# Patient Record
Sex: Female | Born: 1965
Health system: Southern US, Community
[De-identification: ages and names within clinical notes are randomized; demographics above are authoritative.]

## PROBLEM LIST (undated history)

## (undated) DIAGNOSIS — K5792 Diverticulitis of intestine, part unspecified, without perforation or abscess without bleeding: Secondary | ICD-10-CM

## (undated) HISTORY — PX: HERNIA REPAIR: SHX51

## (undated) HISTORY — PX: TONSILLECTOMY: SUR1361

## (undated) HISTORY — PX: LITHOTRIPSY: SUR834

## (undated) HISTORY — PX: HYSTEROTOMY: SHX1776

---

## 2016-06-01 DIAGNOSIS — J019 Acute sinusitis, unspecified: Secondary | ICD-10-CM | POA: Diagnosis not present

## 2016-07-02 DIAGNOSIS — Z01419 Encounter for gynecological examination (general) (routine) without abnormal findings: Secondary | ICD-10-CM | POA: Diagnosis not present

## 2016-07-22 DIAGNOSIS — Z1231 Encounter for screening mammogram for malignant neoplasm of breast: Secondary | ICD-10-CM | POA: Diagnosis not present

## 2016-09-04 DIAGNOSIS — L814 Other melanin hyperpigmentation: Secondary | ICD-10-CM | POA: Diagnosis not present

## 2016-09-04 DIAGNOSIS — L821 Other seborrheic keratosis: Secondary | ICD-10-CM | POA: Diagnosis not present

## 2016-09-04 DIAGNOSIS — D2261 Melanocytic nevi of right upper limb, including shoulder: Secondary | ICD-10-CM | POA: Diagnosis not present

## 2016-09-04 DIAGNOSIS — D2239 Melanocytic nevi of other parts of face: Secondary | ICD-10-CM | POA: Diagnosis not present

## 2016-09-04 DIAGNOSIS — D485 Neoplasm of uncertain behavior of skin: Secondary | ICD-10-CM | POA: Diagnosis not present

## 2016-09-04 DIAGNOSIS — L738 Other specified follicular disorders: Secondary | ICD-10-CM | POA: Diagnosis not present

## 2017-03-26 DIAGNOSIS — Z0000 Encounter for general adult medical examination without abnormal findings: Secondary | ICD-10-CM | POA: Diagnosis not present

## 2017-03-26 DIAGNOSIS — Z13220 Encounter for screening for lipoid disorders: Secondary | ICD-10-CM | POA: Diagnosis not present

## 2017-05-26 DIAGNOSIS — Z1211 Encounter for screening for malignant neoplasm of colon: Secondary | ICD-10-CM | POA: Diagnosis not present

## 2017-05-26 DIAGNOSIS — K5730 Diverticulosis of large intestine without perforation or abscess without bleeding: Secondary | ICD-10-CM | POA: Diagnosis not present

## 2017-06-17 ENCOUNTER — Other Ambulatory Visit: Payer: Self-pay | Admitting: Family Medicine

## 2017-06-17 DIAGNOSIS — R1031 Right lower quadrant pain: Secondary | ICD-10-CM

## 2017-06-18 ENCOUNTER — Ambulatory Visit
Admission: RE | Admit: 2017-06-18 | Discharge: 2017-06-18 | Disposition: A | Discharge status: Home / Self Care | Payer: BLUE CROSS/BLUE SHIELD | Source: Ambulatory Visit | Attending: Family Medicine | Admitting: Family Medicine

## 2017-06-18 DIAGNOSIS — K5732 Diverticulitis of large intestine without perforation or abscess without bleeding: Secondary | ICD-10-CM | POA: Diagnosis not present

## 2017-06-18 DIAGNOSIS — R1031 Right lower quadrant pain: Secondary | ICD-10-CM

## 2017-06-18 MED ORDER — IOPAMIDOL (ISOVUE-300) INJECTION 61%
100.0000 mL | Freq: Once | INTRAVENOUS | Status: AC | PRN
  Administered 2017-06-18: 100 mL via INTRAVENOUS

## 2017-07-06 DIAGNOSIS — Z01419 Encounter for gynecological examination (general) (routine) without abnormal findings: Secondary | ICD-10-CM | POA: Diagnosis not present

## 2017-07-23 DIAGNOSIS — Z1231 Encounter for screening mammogram for malignant neoplasm of breast: Secondary | ICD-10-CM | POA: Diagnosis not present

## 2017-09-09 DIAGNOSIS — D225 Melanocytic nevi of trunk: Secondary | ICD-10-CM | POA: Diagnosis not present

## 2017-09-09 DIAGNOSIS — L814 Other melanin hyperpigmentation: Secondary | ICD-10-CM | POA: Diagnosis not present

## 2017-09-09 DIAGNOSIS — L308 Other specified dermatitis: Secondary | ICD-10-CM | POA: Diagnosis not present

## 2017-09-09 DIAGNOSIS — L821 Other seborrheic keratosis: Secondary | ICD-10-CM | POA: Diagnosis not present

## 2017-09-22 DIAGNOSIS — K5732 Diverticulitis of large intestine without perforation or abscess without bleeding: Secondary | ICD-10-CM | POA: Diagnosis not present

## 2018-03-31 DIAGNOSIS — Z23 Encounter for immunization: Secondary | ICD-10-CM | POA: Diagnosis not present

## 2018-03-31 DIAGNOSIS — Z0000 Encounter for general adult medical examination without abnormal findings: Secondary | ICD-10-CM | POA: Diagnosis not present

## 2018-03-31 DIAGNOSIS — Z136 Encounter for screening for cardiovascular disorders: Secondary | ICD-10-CM | POA: Diagnosis not present

## 2018-05-10 DIAGNOSIS — M7542 Impingement syndrome of left shoulder: Secondary | ICD-10-CM | POA: Diagnosis not present

## 2018-05-10 DIAGNOSIS — M7502 Adhesive capsulitis of left shoulder: Secondary | ICD-10-CM | POA: Diagnosis not present

## 2018-05-10 DIAGNOSIS — M25512 Pain in left shoulder: Secondary | ICD-10-CM | POA: Diagnosis not present

## 2018-05-16 DIAGNOSIS — M7542 Impingement syndrome of left shoulder: Secondary | ICD-10-CM | POA: Insufficient documentation

## 2018-05-16 DIAGNOSIS — M7502 Adhesive capsulitis of left shoulder: Secondary | ICD-10-CM | POA: Insufficient documentation

## 2018-06-22 DIAGNOSIS — M7502 Adhesive capsulitis of left shoulder: Secondary | ICD-10-CM | POA: Diagnosis not present

## 2018-06-22 DIAGNOSIS — M25512 Pain in left shoulder: Secondary | ICD-10-CM | POA: Diagnosis not present

## 2018-07-04 DIAGNOSIS — M7502 Adhesive capsulitis of left shoulder: Secondary | ICD-10-CM | POA: Diagnosis not present

## 2018-07-08 DIAGNOSIS — M7542 Impingement syndrome of left shoulder: Secondary | ICD-10-CM | POA: Diagnosis not present

## 2018-07-14 DIAGNOSIS — Z01419 Encounter for gynecological examination (general) (routine) without abnormal findings: Secondary | ICD-10-CM | POA: Diagnosis not present

## 2018-07-29 DIAGNOSIS — Z1231 Encounter for screening mammogram for malignant neoplasm of breast: Secondary | ICD-10-CM | POA: Diagnosis not present

## 2018-09-28 DIAGNOSIS — D2261 Melanocytic nevi of right upper limb, including shoulder: Secondary | ICD-10-CM | POA: Diagnosis not present

## 2018-09-28 DIAGNOSIS — D1801 Hemangioma of skin and subcutaneous tissue: Secondary | ICD-10-CM | POA: Diagnosis not present

## 2018-09-28 DIAGNOSIS — L821 Other seborrheic keratosis: Secondary | ICD-10-CM | POA: Diagnosis not present

## 2018-09-28 DIAGNOSIS — D2262 Melanocytic nevi of left upper limb, including shoulder: Secondary | ICD-10-CM | POA: Diagnosis not present

## 2019-02-16 DIAGNOSIS — R0789 Other chest pain: Secondary | ICD-10-CM | POA: Diagnosis not present

## 2019-04-06 ENCOUNTER — Other Ambulatory Visit: Payer: Self-pay | Admitting: Family Medicine

## 2019-04-06 DIAGNOSIS — Z13220 Encounter for screening for lipoid disorders: Secondary | ICD-10-CM | POA: Diagnosis not present

## 2019-04-06 DIAGNOSIS — Z0000 Encounter for general adult medical examination without abnormal findings: Secondary | ICD-10-CM | POA: Diagnosis not present

## 2019-04-06 DIAGNOSIS — Z8249 Family history of ischemic heart disease and other diseases of the circulatory system: Secondary | ICD-10-CM

## 2019-04-06 DIAGNOSIS — Z23 Encounter for immunization: Secondary | ICD-10-CM | POA: Diagnosis not present

## 2019-04-18 ENCOUNTER — Other Ambulatory Visit: Payer: Self-pay | Admitting: Family Medicine

## 2019-04-18 DIAGNOSIS — Z8249 Family history of ischemic heart disease and other diseases of the circulatory system: Secondary | ICD-10-CM

## 2019-04-19 ENCOUNTER — Ambulatory Visit
Admission: RE | Admit: 2019-04-19 | Discharge: 2019-04-19 | Disposition: A | Discharge status: Home / Self Care | Payer: BC Managed Care – PPO | Source: Ambulatory Visit | Attending: Family Medicine | Admitting: Family Medicine

## 2019-04-19 ENCOUNTER — Other Ambulatory Visit: Payer: Self-pay

## 2019-04-19 DIAGNOSIS — Z8249 Family history of ischemic heart disease and other diseases of the circulatory system: Secondary | ICD-10-CM

## 2019-07-05 DIAGNOSIS — H02845 Edema of left lower eyelid: Secondary | ICD-10-CM | POA: Diagnosis not present

## 2019-07-05 DIAGNOSIS — L219 Seborrheic dermatitis, unspecified: Secondary | ICD-10-CM | POA: Diagnosis not present

## 2019-07-05 DIAGNOSIS — H02842 Edema of right lower eyelid: Secondary | ICD-10-CM | POA: Diagnosis not present

## 2019-07-05 DIAGNOSIS — L814 Other melanin hyperpigmentation: Secondary | ICD-10-CM | POA: Diagnosis not present

## 2019-07-25 DIAGNOSIS — Z01419 Encounter for gynecological examination (general) (routine) without abnormal findings: Secondary | ICD-10-CM | POA: Diagnosis not present

## 2019-08-10 DIAGNOSIS — Z1231 Encounter for screening mammogram for malignant neoplasm of breast: Secondary | ICD-10-CM | POA: Diagnosis not present

## 2019-08-15 DIAGNOSIS — H5310 Unspecified subjective visual disturbances: Secondary | ICD-10-CM | POA: Diagnosis not present

## 2019-08-22 DIAGNOSIS — H43811 Vitreous degeneration, right eye: Secondary | ICD-10-CM | POA: Diagnosis not present

## 2019-08-22 DIAGNOSIS — H43391 Other vitreous opacities, right eye: Secondary | ICD-10-CM | POA: Diagnosis not present

## 2019-09-22 DIAGNOSIS — H43391 Other vitreous opacities, right eye: Secondary | ICD-10-CM | POA: Diagnosis not present

## 2019-09-22 DIAGNOSIS — H43822 Vitreomacular adhesion, left eye: Secondary | ICD-10-CM | POA: Diagnosis not present

## 2019-09-22 DIAGNOSIS — H43811 Vitreous degeneration, right eye: Secondary | ICD-10-CM | POA: Diagnosis not present

## 2019-09-25 DIAGNOSIS — L578 Other skin changes due to chronic exposure to nonionizing radiation: Secondary | ICD-10-CM | POA: Diagnosis not present

## 2019-09-25 DIAGNOSIS — L570 Actinic keratosis: Secondary | ICD-10-CM | POA: Diagnosis not present

## 2019-09-25 DIAGNOSIS — L814 Other melanin hyperpigmentation: Secondary | ICD-10-CM | POA: Diagnosis not present

## 2019-09-25 DIAGNOSIS — D225 Melanocytic nevi of trunk: Secondary | ICD-10-CM | POA: Diagnosis not present

## 2019-09-25 DIAGNOSIS — N3001 Acute cystitis with hematuria: Secondary | ICD-10-CM | POA: Diagnosis not present

## 2019-09-25 DIAGNOSIS — D2371 Other benign neoplasm of skin of right lower limb, including hip: Secondary | ICD-10-CM | POA: Diagnosis not present

## 2019-12-29 DIAGNOSIS — G4700 Insomnia, unspecified: Secondary | ICD-10-CM | POA: Diagnosis not present

## 2019-12-29 DIAGNOSIS — R300 Dysuria: Secondary | ICD-10-CM | POA: Diagnosis not present

## 2019-12-29 DIAGNOSIS — R350 Frequency of micturition: Secondary | ICD-10-CM | POA: Diagnosis not present

## 2019-12-29 DIAGNOSIS — N390 Urinary tract infection, site not specified: Secondary | ICD-10-CM | POA: Diagnosis not present

## 2020-04-16 DIAGNOSIS — Z13220 Encounter for screening for lipoid disorders: Secondary | ICD-10-CM | POA: Diagnosis not present

## 2020-04-16 DIAGNOSIS — Z0000 Encounter for general adult medical examination without abnormal findings: Secondary | ICD-10-CM | POA: Diagnosis not present

## 2020-04-16 DIAGNOSIS — R7303 Prediabetes: Secondary | ICD-10-CM | POA: Diagnosis not present

## 2020-05-01 ENCOUNTER — Ambulatory Visit: Payer: BC Managed Care – PPO | Admitting: Podiatry

## 2020-05-01 ENCOUNTER — Ambulatory Visit (INDEPENDENT_AMBULATORY_CARE_PROVIDER_SITE_OTHER): Payer: BC Managed Care – PPO

## 2020-05-01 ENCOUNTER — Other Ambulatory Visit: Payer: Self-pay

## 2020-05-01 DIAGNOSIS — M79672 Pain in left foot: Secondary | ICD-10-CM

## 2020-05-01 DIAGNOSIS — M79671 Pain in right foot: Secondary | ICD-10-CM

## 2020-05-01 DIAGNOSIS — M85852 Other specified disorders of bone density and structure, left thigh: Secondary | ICD-10-CM | POA: Diagnosis not present

## 2020-05-01 DIAGNOSIS — Z780 Asymptomatic menopausal state: Secondary | ICD-10-CM | POA: Diagnosis not present

## 2020-05-01 MED ORDER — MELOXICAM 15 MG PO TABS: 15.0000 mg | ORAL_TABLET | Freq: Every day | ORAL | 1 refills | Status: DC

## 2020-05-01 NOTE — Progress Notes (Signed)
   HPI: 54 y.o. female very healthy presenting today as a new patient for evaluation of generalized foot pain to the bilateral feet.  Patient states this is been ongoing for several years now.  She wears Vionic shoes and sandals as well as good supportive sneakers from [employer] running store.  Patient states that intermittently she gets what she believes is inflammatory reaction to her the bilateral feet.  She cannot recall any alleviating or aggravating factors other than being on her feet for long periods of time.  She presents for further treatment and evaluation  No past medical history on file.   Physical Exam: General: The patient is alert and oriented x3 in no acute distress.  Dermatology: Skin is warm, dry and supple bilateral lower extremities. Negative for open lesions or macerations.  Vascular: Palpable pedal pulses bilaterally. No edema or erythema noted. Capillary refill within normal limits.  Neurological: Epicritic and protective threshold grossly intact bilaterally.   Musculoskeletal Exam: Range of motion within normal limits to all pedal and ankle joints bilateral. Muscle strength 5/5 in all groups bilateral.   Radiographic Exam:  Normal osseous mineralization. Joint spaces preserved. No fracture/dislocation/boney destruction.  Bipartite sesamoid noted left foot  Assessment: 1.  Generalized foot pain bilateral  Plan of Care:  1. Patient evaluated. X-Rays reviewed.  2. I explained to the patient that her feet appear clinically very healthy.  She is wearing good supportive shoes.  Continue full activity with no restrictions wearing good supportive sneakers and sandals 3.  Prescription for meloxicam 15 mg daily as needed for days that she has increased achiness 4.  Return to clinic as needed      Felecia Shelling, DPM [facility]  Dr. Felecia Shelling, DPM    [address] [place], [place] [address]                  Office [phone]  Fax [phone]

## 2020-05-16 ENCOUNTER — Telehealth: Payer: BC Managed Care – PPO | Admitting: Family Medicine

## 2020-07-01 DIAGNOSIS — R351 Nocturia: Secondary | ICD-10-CM | POA: Diagnosis not present

## 2020-07-01 DIAGNOSIS — N393 Stress incontinence (female) (male): Secondary | ICD-10-CM | POA: Diagnosis not present

## 2020-07-01 DIAGNOSIS — Z01419 Encounter for gynecological examination (general) (routine) without abnormal findings: Secondary | ICD-10-CM | POA: Diagnosis not present

## 2020-07-09 DIAGNOSIS — H40013 Open angle with borderline findings, low risk, bilateral: Secondary | ICD-10-CM | POA: Diagnosis not present

## 2020-07-09 DIAGNOSIS — Z713 Dietary counseling and surveillance: Secondary | ICD-10-CM | POA: Diagnosis not present

## 2020-07-22 DIAGNOSIS — N3946 Mixed incontinence: Secondary | ICD-10-CM | POA: Diagnosis not present

## 2020-08-12 DIAGNOSIS — Z1231 Encounter for screening mammogram for malignant neoplasm of breast: Secondary | ICD-10-CM | POA: Diagnosis not present

## 2020-08-22 DIAGNOSIS — U071 COVID-19: Secondary | ICD-10-CM | POA: Diagnosis not present

## 2020-08-22 DIAGNOSIS — Z20822 Contact with and (suspected) exposure to covid-19: Secondary | ICD-10-CM | POA: Diagnosis not present

## 2020-08-26 DIAGNOSIS — Z03818 Encounter for observation for suspected exposure to other biological agents ruled out: Secondary | ICD-10-CM | POA: Diagnosis not present

## 2020-08-26 DIAGNOSIS — Z20822 Contact with and (suspected) exposure to covid-19: Secondary | ICD-10-CM | POA: Diagnosis not present

## 2020-09-04 DIAGNOSIS — R300 Dysuria: Secondary | ICD-10-CM | POA: Diagnosis not present

## 2020-09-04 DIAGNOSIS — M8588 Other specified disorders of bone density and structure, other site: Secondary | ICD-10-CM | POA: Diagnosis not present

## 2020-09-12 DIAGNOSIS — Z713 Dietary counseling and surveillance: Secondary | ICD-10-CM | POA: Diagnosis not present

## 2020-09-18 DIAGNOSIS — N3946 Mixed incontinence: Secondary | ICD-10-CM | POA: Diagnosis not present

## 2020-09-26 DIAGNOSIS — N390 Urinary tract infection, site not specified: Secondary | ICD-10-CM | POA: Diagnosis not present

## 2020-09-26 DIAGNOSIS — R7303 Prediabetes: Secondary | ICD-10-CM | POA: Diagnosis not present

## 2020-09-26 DIAGNOSIS — R5383 Other fatigue: Secondary | ICD-10-CM | POA: Diagnosis not present

## 2020-10-02 DIAGNOSIS — D2371 Other benign neoplasm of skin of right lower limb, including hip: Secondary | ICD-10-CM | POA: Diagnosis not present

## 2020-10-02 DIAGNOSIS — L570 Actinic keratosis: Secondary | ICD-10-CM | POA: Diagnosis not present

## 2020-10-02 DIAGNOSIS — D225 Melanocytic nevi of trunk: Secondary | ICD-10-CM | POA: Diagnosis not present

## 2020-10-02 DIAGNOSIS — L821 Other seborrheic keratosis: Secondary | ICD-10-CM | POA: Diagnosis not present

## 2020-10-02 DIAGNOSIS — L578 Other skin changes due to chronic exposure to nonionizing radiation: Secondary | ICD-10-CM | POA: Diagnosis not present

## 2020-11-04 DIAGNOSIS — Z90711 Acquired absence of uterus with remaining cervical stump: Secondary | ICD-10-CM | POA: Diagnosis not present

## 2020-11-04 DIAGNOSIS — R5383 Other fatigue: Secondary | ICD-10-CM | POA: Diagnosis not present

## 2020-11-04 DIAGNOSIS — L659 Nonscarring hair loss, unspecified: Secondary | ICD-10-CM | POA: Diagnosis not present

## 2020-11-04 DIAGNOSIS — E538 Deficiency of other specified B group vitamins: Secondary | ICD-10-CM | POA: Diagnosis not present

## 2021-01-07 DIAGNOSIS — N390 Urinary tract infection, site not specified: Secondary | ICD-10-CM | POA: Diagnosis not present

## 2021-02-10 DIAGNOSIS — Z90711 Acquired absence of uterus with remaining cervical stump: Secondary | ICD-10-CM | POA: Diagnosis not present

## 2021-02-10 DIAGNOSIS — E538 Deficiency of other specified B group vitamins: Secondary | ICD-10-CM | POA: Diagnosis not present

## 2021-02-10 DIAGNOSIS — L659 Nonscarring hair loss, unspecified: Secondary | ICD-10-CM | POA: Diagnosis not present

## 2021-02-10 DIAGNOSIS — R5383 Other fatigue: Secondary | ICD-10-CM | POA: Diagnosis not present

## 2021-02-14 DIAGNOSIS — R3989 Other symptoms and signs involving the genitourinary system: Secondary | ICD-10-CM | POA: Diagnosis not present

## 2021-02-14 DIAGNOSIS — N390 Urinary tract infection, site not specified: Secondary | ICD-10-CM | POA: Diagnosis not present

## 2021-02-14 DIAGNOSIS — N3946 Mixed incontinence: Secondary | ICD-10-CM | POA: Diagnosis not present

## 2021-02-27 ENCOUNTER — Emergency Department (HOSPITAL_BASED_OUTPATIENT_CLINIC_OR_DEPARTMENT_OTHER)
Admission: EM | Admit: 2021-02-27 | Discharge: 2021-02-27 | Disposition: A | Discharge status: Home / Self Care | Payer: BC Managed Care – PPO | Attending: Emergency Medicine | Admitting: Emergency Medicine

## 2021-02-27 ENCOUNTER — Encounter (HOSPITAL_BASED_OUTPATIENT_CLINIC_OR_DEPARTMENT_OTHER): Payer: Self-pay | Admitting: Emergency Medicine

## 2021-02-27 ENCOUNTER — Other Ambulatory Visit: Payer: Self-pay

## 2021-02-27 DIAGNOSIS — Z5321 Procedure and treatment not carried out due to patient leaving prior to being seen by health care provider: Secondary | ICD-10-CM | POA: Insufficient documentation

## 2021-02-27 DIAGNOSIS — R109 Unspecified abdominal pain: Secondary | ICD-10-CM | POA: Diagnosis not present

## 2021-02-27 DIAGNOSIS — R1032 Left lower quadrant pain: Secondary | ICD-10-CM | POA: Diagnosis not present

## 2021-02-27 HISTORY — DX: Diverticulitis of intestine, part unspecified, without perforation or abscess without bleeding: K57.92

## 2021-02-27 LAB — COMPREHENSIVE METABOLIC PANEL
ALT: 17 U/L (ref 0–44)
AST: 16 U/L (ref 15–41)
Albumin: 4.7 g/dL (ref 3.5–5.0)
Alkaline Phosphatase: 79 U/L (ref 38–126)
Anion gap: 12 (ref 5–15)
BUN: 12 mg/dL (ref 6–20)
CO2: 24 mmol/L (ref 22–32)
Calcium: 9.3 mg/dL (ref 8.9–10.3)
Chloride: 104 mmol/L (ref 98–111)
Creatinine, Ser: 0.77 mg/dL (ref 0.44–1.00)
GFR, Estimated: >60 mL/min (ref >60)
Glucose, Bld: 95 mg/dL (ref 70–99)
Potassium: 3.3 mmol/L — ABNORMAL LOW (ref 3.5–5.1)
Sodium: 140 mmol/L (ref 135–145)
Total Bilirubin: 1.5 mg/dL — ABNORMAL HIGH (ref 0.3–1.2)
Total Protein: 7.6 g/dL (ref 6.5–8.1)

## 2021-02-27 LAB — CBC
HCT: 38.3 % (ref 36.0–46.0)
Hemoglobin: 13.1 g/dL (ref 12.0–15.0)
MCH: 29.2 pg (ref 26.0–34.0)
MCHC: 34.2 g/dL (ref 30.0–36.0)
MCV: 85.3 fL (ref 80.0–100.0)
Platelets: 217 10*3/uL (ref 150–400)
RBC: 4.49 MIL/uL (ref 3.87–5.11)
RDW: 12.9 % (ref 11.5–15.5)
WBC: 11.4 10*3/uL — ABNORMAL HIGH (ref 4.0–10.5)
nRBC: 0 % (ref 0.0–0.2)

## 2021-02-27 LAB — LIPASE, BLOOD: Lipase: 17 U/L (ref 11–51)

## 2021-02-27 NOTE — ED Triage Notes (Addendum)
Abd pain and constipation since yesterday  center left pain , has hx of diverticulitis she states, states was seen  at [facility] today and told to get CT

## 2021-02-28 ENCOUNTER — Other Ambulatory Visit (HOSPITAL_BASED_OUTPATIENT_CLINIC_OR_DEPARTMENT_OTHER): Payer: Self-pay | Admitting: Family Medicine

## 2021-02-28 ENCOUNTER — Ambulatory Visit (HOSPITAL_BASED_OUTPATIENT_CLINIC_OR_DEPARTMENT_OTHER)
Admission: RE | Admit: 2021-02-28 | Discharge: 2021-02-28 | Disposition: A | Discharge status: Home / Self Care | Payer: BC Managed Care – PPO | Source: Ambulatory Visit | Attending: Family Medicine | Admitting: Family Medicine

## 2021-02-28 ENCOUNTER — Encounter (HOSPITAL_BASED_OUTPATIENT_CLINIC_OR_DEPARTMENT_OTHER): Payer: Self-pay

## 2021-02-28 DIAGNOSIS — R1030 Lower abdominal pain, unspecified: Secondary | ICD-10-CM | POA: Insufficient documentation

## 2021-02-28 DIAGNOSIS — R109 Unspecified abdominal pain: Secondary | ICD-10-CM | POA: Diagnosis not present

## 2021-02-28 MED ORDER — IOHEXOL 300 MG/ML  SOLN
75.0000 mL | Freq: Once | INTRAMUSCULAR | Status: AC | PRN
  Administered 2021-02-28: 75 mL via INTRAVENOUS

## 2021-03-17 DIAGNOSIS — H0288B Meibomian gland dysfunction left eye, upper and lower eyelids: Secondary | ICD-10-CM | POA: Diagnosis not present

## 2021-03-17 DIAGNOSIS — H0288A Meibomian gland dysfunction right eye, upper and lower eyelids: Secondary | ICD-10-CM | POA: Diagnosis not present

## 2021-03-17 DIAGNOSIS — H16223 Keratoconjunctivitis sicca, not specified as Sjogren's, bilateral: Secondary | ICD-10-CM | POA: Diagnosis not present

## 2021-04-23 DIAGNOSIS — R5382 Chronic fatigue, unspecified: Secondary | ICD-10-CM | POA: Diagnosis not present

## 2021-04-23 DIAGNOSIS — L659 Nonscarring hair loss, unspecified: Secondary | ICD-10-CM | POA: Diagnosis not present

## 2021-04-23 DIAGNOSIS — Z13220 Encounter for screening for lipoid disorders: Secondary | ICD-10-CM | POA: Diagnosis not present

## 2021-04-23 DIAGNOSIS — G4700 Insomnia, unspecified: Secondary | ICD-10-CM | POA: Diagnosis not present

## 2021-04-23 DIAGNOSIS — M8588 Other specified disorders of bone density and structure, other site: Secondary | ICD-10-CM | POA: Diagnosis not present

## 2021-04-23 DIAGNOSIS — Z8719 Personal history of other diseases of the digestive system: Secondary | ICD-10-CM | POA: Diagnosis not present

## 2021-04-23 DIAGNOSIS — Z0000 Encounter for general adult medical examination without abnormal findings: Secondary | ICD-10-CM | POA: Diagnosis not present

## 2021-05-27 DIAGNOSIS — H40013 Open angle with borderline findings, low risk, bilateral: Secondary | ICD-10-CM | POA: Diagnosis not present

## 2021-06-24 DIAGNOSIS — R202 Paresthesia of skin: Secondary | ICD-10-CM | POA: Diagnosis not present

## 2021-06-24 DIAGNOSIS — R7303 Prediabetes: Secondary | ICD-10-CM | POA: Diagnosis not present

## 2021-06-24 DIAGNOSIS — G514 Facial myokymia: Secondary | ICD-10-CM | POA: Diagnosis not present

## 2021-06-24 DIAGNOSIS — G4489 Other headache syndrome: Secondary | ICD-10-CM | POA: Diagnosis not present

## 2021-06-25 ENCOUNTER — Other Ambulatory Visit: Payer: Self-pay | Admitting: Family Medicine

## 2021-06-25 DIAGNOSIS — G4489 Other headache syndrome: Secondary | ICD-10-CM

## 2021-06-25 DIAGNOSIS — G514 Facial myokymia: Secondary | ICD-10-CM

## 2021-06-25 DIAGNOSIS — R202 Paresthesia of skin: Secondary | ICD-10-CM

## 2021-07-01 ENCOUNTER — Ambulatory Visit: Payer: BC Managed Care – PPO | Admitting: Neurology

## 2021-07-01 ENCOUNTER — Encounter: Payer: Self-pay | Admitting: Neurology

## 2021-07-01 ENCOUNTER — Ambulatory Visit
Admission: RE | Admit: 2021-07-01 | Discharge: 2021-07-01 | Disposition: A | Discharge status: Home / Self Care | Payer: BC Managed Care – PPO | Source: Ambulatory Visit | Attending: Family Medicine | Admitting: Family Medicine

## 2021-07-01 VITALS — BP 128/69 | HR 69 | Ht 65.0 in | Wt 160.5 lb

## 2021-07-01 DIAGNOSIS — F419 Anxiety disorder, unspecified: Secondary | ICD-10-CM | POA: Insufficient documentation

## 2021-07-01 DIAGNOSIS — R32 Unspecified urinary incontinence: Secondary | ICD-10-CM | POA: Insufficient documentation

## 2021-07-01 DIAGNOSIS — K5792 Diverticulitis of intestine, part unspecified, without perforation or abscess without bleeding: Secondary | ICD-10-CM | POA: Insufficient documentation

## 2021-07-01 DIAGNOSIS — N3281 Overactive bladder: Secondary | ICD-10-CM | POA: Insufficient documentation

## 2021-07-01 DIAGNOSIS — R202 Paresthesia of skin: Secondary | ICD-10-CM | POA: Diagnosis not present

## 2021-07-01 DIAGNOSIS — R253 Fasciculation: Secondary | ICD-10-CM | POA: Diagnosis not present

## 2021-07-01 DIAGNOSIS — G44209 Tension-type headache, unspecified, not intractable: Secondary | ICD-10-CM | POA: Insufficient documentation

## 2021-07-01 DIAGNOSIS — G9332 Myalgic encephalomyelitis/chronic fatigue syndrome: Secondary | ICD-10-CM | POA: Insufficient documentation

## 2021-07-01 DIAGNOSIS — G4489 Other headache syndrome: Secondary | ICD-10-CM | POA: Diagnosis not present

## 2021-07-01 DIAGNOSIS — E2839 Other primary ovarian failure: Secondary | ICD-10-CM | POA: Insufficient documentation

## 2021-07-01 DIAGNOSIS — G4700 Insomnia, unspecified: Secondary | ICD-10-CM | POA: Insufficient documentation

## 2021-07-01 DIAGNOSIS — G514 Facial myokymia: Secondary | ICD-10-CM

## 2021-07-01 DIAGNOSIS — N3941 Urge incontinence: Secondary | ICD-10-CM | POA: Insufficient documentation

## 2021-07-01 MED ORDER — GADOBENATE DIMEGLUMINE 529 MG/ML IV SOLN
14.0000 mL | Freq: Once | INTRAVENOUS | Status: AC | PRN
  Administered 2021-07-01: 14 mL via INTRAVENOUS

## 2021-07-01 NOTE — Progress Notes (Addendum)
Chief Complaint  Patient presents with   New Patient (Initial Visit)    Rm 14. Alone. NP/Paper Proficient/PCP is Horton Marshall. Referred for  facial twitches, paresthesia, hx of abnormal MRI concern for MS.  Pt states she previously had numbness of hands, feet, and face when she was 29 and was told to follow up if the problem occurred again. Pt c/o fatigue. Pt complains of burning pain on the left side of her head.       ASSESSMENT AND PLAN  Ariana Shepard is a 55 y.o. female   Intermittent facial paresthesia,  Reported similar history and abnormal MRI of the brain in the past,  MRI of the brain with without contrast is pending for today, will call her report-addendum: MRI report from July 01, 2021, unremarkable, no acute intracranial abnormalities  Laboratory evaluation showed no significant abnormalities,   DIAGNOSTIC DATA (LABS, IMAGING, TESTING) - I reviewed patient records, labs, notes, testing and imaging myself where available.  Laboratory evaluation in September 2022, normal CBC hemoglobin 13.5, CMP creatinine of 0.7, LDL of 135, normal vitamin D  MEDICAL HISTORY:  Ariana Shepard, is a 55 year old female, seen in request by Dr. Aliene Beams, for evaluation of facial paresthesia, initial evaluation was on July 01, 2021  I reviewed and summarized the referring note.PMHx.  Chronic Insomnia Kidney stone.  She reported at age 17, she had an episode of facial paresthesia, at that time, she had MRI, there was concern of some mild abnormality, but no diagnosis was made  In early November 2022, she began to notice mid left lower lip quivering, intermittent, later she also noticed paresthesia of the lower face around her chin, as if she got the wind burned, symptoms come and goes, there is no limitation in her function, overall has improved  Because of her concern of possible MS, especially with previous abnormal MRI, MRI of the brain was ordered  She denies  visual complaints, has continued her yearly ophthalmology evaluation, no lateralized motor or sensory deficit, no gait abnormalities, laboratory evaluation recently including TSH  B12 was reported normal  PHYSICAL EXAM:   Vitals:   07/01/21 1032  BP: 128/69  Pulse: 69  Weight: 160 lb 8 oz (72.8 kg)  Height: 5\' 5"  (1.651 m)   Not recorded     Body mass index is 26.71 kg/m.  PHYSICAL EXAMNIATION:  Gen: NAD, conversant, well nourised, well groomed                     Cardiovascular: Regular rate rhythm, no peripheral edema, warm, nontender. Eyes: Conjunctivae clear without exudates or hemorrhage Neck: Supple, no carotid bruits. Pulmonary: Clear to auscultation bilaterally   NEUROLOGICAL EXAM:  MENTAL STATUS: Speech:    Speech is normal; fluent and spontaneous with normal comprehension.  Cognition:     Orientation to time, place and person     Normal recent and remote memory     Normal Attention span and concentration     Normal Language, naming, repeating,spontaneous speech     Fund of knowledge   CRANIAL NERVES: CN II: Visual fields are full to confrontation. Pupils are round equal and briskly reactive to light. CN III, IV, VI: extraocular movement are normal. No ptosis. CN V: Facial sensation is intact to light touch CN VII: Face is symmetric with normal eye closure  CN VIII: Hearing is normal to causal conversation. CN IX, X: Phonation is normal. CN XI: Head turning and shoulder shrug are intact  MOTOR: There is no pronator drift of out-stretched arms. Muscle bulk and tone are normal. Muscle strength is normal.  REFLEXES: Reflexes are 2+ and symmetric at the biceps, triceps, knees, and ankles. Plantar responses are flexor.  SENSORY: Intact to light touch, pinprick and vibratory sensation are intact in fingers and toes.  COORDINATION: There is no trunk or limb dysmetria noted.  GAIT/STANCE: Posture is normal. Gait is steady with normal steps, base, arm  swing, and turning. Heel and toe walking are normal. Tandem gait is normal.  Romberg is absent.  REVIEW OF SYSTEMS:  Full 14 system review of systems performed and notable only for as above All other review of systems were negative.   ALLERGIES: No Known Allergies  HOME MEDICATIONS: Current Outpatient Medications  Medication Sig Dispense Refill   acetaminophen (TYLENOL) 325 MG tablet 1 tablet as needed     albuterol (VENTOLIN HFA) 108 (90 Base) MCG/ACT inhaler Inhale into the lungs.     ALPRAZolam (XANAX) 0.25 MG tablet 1 tablet     aspirin 81 MG chewable tablet 1 tablet     Cholecalciferol (VITAMIN D3) 50 MCG (2000 UT) capsule 5 capsules     COLLAGEN PO Take 1 tablet by mouth.     diphenhydrAMINE (BENADRYL) 25 MG tablet 1 tablet at bedtime as needed     melatonin 3 MG TABS tablet 3 tablets at bedtime as needed with food     naproxen sodium (ALEVE) 220 MG tablet 1 tablet with food or milk as needed     nitrofurantoin (MACRODANTIN) 100 MG capsule SMARTSIG:1 Capsule(s) By Mouth Every 3 Days     Nutritional Supplements (FRUIT & VEGETABLE DAILY) CAPS See admin instructions.     solifenacin (VESICARE) 5 MG tablet 1 tablet     Wheat Dextrin (BENEFIBER DRINK MIX PO) Take 1.5 tablets by mouth daily.     zolpidem (AMBIEN) 10 MG tablet 1 tablet at bedtime as needed     No current facility-administered medications for this visit.    PAST MEDICAL HISTORY: Past Medical History:  Diagnosis Date   Diverticulitis     PAST SURGICAL HISTORY: Past Surgical History:  Procedure Laterality Date   HERNIA REPAIR     HYSTEROTOMY     LITHOTRIPSY     TONSILLECTOMY      FAMILY HISTORY: Family History  Problem Relation Age of Onset   Hyperlipidemia Mother    AAA (abdominal aortic aneurysm) Mother    Osteoporosis Mother    Hypertension Mother    CVA Mother    Heart disease Father    Hypertension Father    Dementia Father     SOCIAL HISTORY: Social History   Socioeconomic History    Marital status: Married    Spouse name: Not on file   Number of children: Not on file   Years of education: Not on file   Highest education level: Not on file  Occupational History   Not on file  Tobacco Use   Smoking status: Never    Passive exposure: Never   Smokeless tobacco: Never  Vaping Use   Vaping Use: Never used  Substance and Sexual Activity   Alcohol use: Yes    Alcohol/week: 1.0 standard drink    Types: 1 Glasses of wine per week   Drug use: Never   Sexual activity: Not on file  Other Topics Concern   Not on file  Social History Narrative   Not on file   Social Determinants of Health  Financial Resource Strain: Not on file  Food Insecurity: Not on file  Transportation Needs: Not on file  Physical Activity: Not on file  Stress: Not on file  Social Connections: Not on file  Intimate Partner Violence: Not on file      Levert Feinstein, M.D. Ph.D.  [facility] [address] [place], [place] [address] Ph: [phone] Fax: [phone]  CC:  Aliene Beams, MD [address] [place],  [place] [address]  Wilfrid Lund, PA

## 2021-07-04 ENCOUNTER — Telehealth: Payer: Self-pay | Admitting: Neurology

## 2021-07-04 NOTE — Telephone Encounter (Signed)
Please call patient, MRI of the brain with without contrast July 01, 2021 from [facility] imaging showed no significant abnormality.

## 2021-07-07 NOTE — Telephone Encounter (Signed)
Left voicemail on patient's answering machine (as per DPR). Informed of normal results and asked to call back with any further questions or concerns.

## 2021-07-16 DIAGNOSIS — Z01419 Encounter for gynecological examination (general) (routine) without abnormal findings: Secondary | ICD-10-CM | POA: Diagnosis not present

## 2021-08-14 DIAGNOSIS — Z1231 Encounter for screening mammogram for malignant neoplasm of breast: Secondary | ICD-10-CM | POA: Diagnosis not present

## 2021-09-12 DIAGNOSIS — Z713 Dietary counseling and surveillance: Secondary | ICD-10-CM | POA: Diagnosis not present

## 2021-09-22 DIAGNOSIS — M5451 Vertebrogenic low back pain: Secondary | ICD-10-CM | POA: Diagnosis not present

## 2021-09-24 DIAGNOSIS — R351 Nocturia: Secondary | ICD-10-CM | POA: Diagnosis not present

## 2021-09-24 DIAGNOSIS — R8271 Bacteriuria: Secondary | ICD-10-CM | POA: Diagnosis not present

## 2021-09-24 DIAGNOSIS — R350 Frequency of micturition: Secondary | ICD-10-CM | POA: Diagnosis not present

## 2021-09-24 DIAGNOSIS — N3946 Mixed incontinence: Secondary | ICD-10-CM | POA: Diagnosis not present

## 2021-09-25 DIAGNOSIS — D225 Melanocytic nevi of trunk: Secondary | ICD-10-CM | POA: Diagnosis not present

## 2021-09-25 DIAGNOSIS — L578 Other skin changes due to chronic exposure to nonionizing radiation: Secondary | ICD-10-CM | POA: Diagnosis not present

## 2021-09-25 DIAGNOSIS — L821 Other seborrheic keratosis: Secondary | ICD-10-CM | POA: Diagnosis not present

## 2021-09-25 DIAGNOSIS — D485 Neoplasm of uncertain behavior of skin: Secondary | ICD-10-CM | POA: Diagnosis not present

## 2021-09-25 DIAGNOSIS — L219 Seborrheic dermatitis, unspecified: Secondary | ICD-10-CM | POA: Diagnosis not present

## 2021-10-02 DIAGNOSIS — N3946 Mixed incontinence: Secondary | ICD-10-CM | POA: Diagnosis not present

## 2021-10-13 DIAGNOSIS — Z713 Dietary counseling and surveillance: Secondary | ICD-10-CM | POA: Diagnosis not present

## 2021-10-16 DIAGNOSIS — L905 Scar conditions and fibrosis of skin: Secondary | ICD-10-CM | POA: Diagnosis not present

## 2021-10-16 DIAGNOSIS — D485 Neoplasm of uncertain behavior of skin: Secondary | ICD-10-CM | POA: Diagnosis not present

## 2021-10-16 DIAGNOSIS — N3946 Mixed incontinence: Secondary | ICD-10-CM | POA: Diagnosis not present

## 2021-12-03 DIAGNOSIS — Z713 Dietary counseling and surveillance: Secondary | ICD-10-CM | POA: Diagnosis not present

## 2021-12-05 DIAGNOSIS — R222 Localized swelling, mass and lump, trunk: Secondary | ICD-10-CM | POA: Diagnosis not present

## 2021-12-10 DIAGNOSIS — L814 Other melanin hyperpigmentation: Secondary | ICD-10-CM | POA: Diagnosis not present

## 2021-12-15 ENCOUNTER — Other Ambulatory Visit: Payer: Self-pay | Admitting: Family Medicine

## 2021-12-15 DIAGNOSIS — R222 Localized swelling, mass and lump, trunk: Secondary | ICD-10-CM

## 2021-12-17 ENCOUNTER — Ambulatory Visit
Admission: RE | Admit: 2021-12-17 | Discharge: 2021-12-17 | Disposition: A | Discharge status: Home / Self Care | Payer: BC Managed Care – PPO | Source: Ambulatory Visit | Attending: Family Medicine | Admitting: Family Medicine

## 2021-12-17 ENCOUNTER — Other Ambulatory Visit: Payer: BC Managed Care – PPO

## 2021-12-17 ENCOUNTER — Other Ambulatory Visit: Payer: Self-pay | Admitting: Internal Medicine

## 2021-12-17 DIAGNOSIS — J690 Pneumonitis due to inhalation of food and vomit: Secondary | ICD-10-CM

## 2021-12-17 DIAGNOSIS — R222 Localized swelling, mass and lump, trunk: Secondary | ICD-10-CM

## 2021-12-18 ENCOUNTER — Other Ambulatory Visit: Payer: BC Managed Care – PPO

## 2022-01-05 DIAGNOSIS — H16141 Punctate keratitis, right eye: Secondary | ICD-10-CM | POA: Diagnosis not present

## 2022-01-12 DIAGNOSIS — H16141 Punctate keratitis, right eye: Secondary | ICD-10-CM | POA: Diagnosis not present

## 2022-01-12 DIAGNOSIS — H0288A Meibomian gland dysfunction right eye, upper and lower eyelids: Secondary | ICD-10-CM | POA: Diagnosis not present

## 2022-01-12 DIAGNOSIS — H0288B Meibomian gland dysfunction left eye, upper and lower eyelids: Secondary | ICD-10-CM | POA: Diagnosis not present

## 2022-02-19 DIAGNOSIS — Z713 Dietary counseling and surveillance: Secondary | ICD-10-CM | POA: Diagnosis not present

## 2022-04-28 DIAGNOSIS — Z713 Dietary counseling and surveillance: Secondary | ICD-10-CM | POA: Diagnosis not present

## 2022-04-29 DIAGNOSIS — M8588 Other specified disorders of bone density and structure, other site: Secondary | ICD-10-CM | POA: Diagnosis not present

## 2022-04-29 DIAGNOSIS — F418 Other specified anxiety disorders: Secondary | ICD-10-CM | POA: Diagnosis not present

## 2022-04-29 DIAGNOSIS — E2839 Other primary ovarian failure: Secondary | ICD-10-CM | POA: Diagnosis not present

## 2022-04-29 DIAGNOSIS — Z0000 Encounter for general adult medical examination without abnormal findings: Secondary | ICD-10-CM | POA: Diagnosis not present

## 2022-04-29 DIAGNOSIS — G4700 Insomnia, unspecified: Secondary | ICD-10-CM | POA: Diagnosis not present

## 2022-04-30 DIAGNOSIS — M8588 Other specified disorders of bone density and structure, other site: Secondary | ICD-10-CM | POA: Diagnosis not present

## 2022-04-30 DIAGNOSIS — Z13220 Encounter for screening for lipoid disorders: Secondary | ICD-10-CM | POA: Diagnosis not present

## 2022-04-30 DIAGNOSIS — Z789 Other specified health status: Secondary | ICD-10-CM | POA: Diagnosis not present

## 2022-04-30 DIAGNOSIS — Z0000 Encounter for general adult medical examination without abnormal findings: Secondary | ICD-10-CM | POA: Diagnosis not present

## 2022-05-25 DIAGNOSIS — Z713 Dietary counseling and surveillance: Secondary | ICD-10-CM | POA: Diagnosis not present

## 2022-06-17 DIAGNOSIS — M8589 Other specified disorders of bone density and structure, multiple sites: Secondary | ICD-10-CM | POA: Diagnosis not present

## 2022-07-20 DIAGNOSIS — Z713 Dietary counseling and surveillance: Secondary | ICD-10-CM | POA: Diagnosis not present

## 2022-07-20 DIAGNOSIS — Z01419 Encounter for gynecological examination (general) (routine) without abnormal findings: Secondary | ICD-10-CM | POA: Diagnosis not present

## 2022-07-24 DIAGNOSIS — R8271 Bacteriuria: Secondary | ICD-10-CM | POA: Diagnosis not present

## 2022-07-24 DIAGNOSIS — N3946 Mixed incontinence: Secondary | ICD-10-CM | POA: Diagnosis not present

## 2022-07-24 DIAGNOSIS — N3000 Acute cystitis without hematuria: Secondary | ICD-10-CM | POA: Diagnosis not present

## 2022-07-24 DIAGNOSIS — R351 Nocturia: Secondary | ICD-10-CM | POA: Diagnosis not present

## 2022-08-17 DIAGNOSIS — Z1231 Encounter for screening mammogram for malignant neoplasm of breast: Secondary | ICD-10-CM | POA: Diagnosis not present

## 2022-08-18 DIAGNOSIS — H02423 Myogenic ptosis of bilateral eyelids: Secondary | ICD-10-CM | POA: Diagnosis not present

## 2022-08-18 DIAGNOSIS — H02832 Dermatochalasis of right lower eyelid: Secondary | ICD-10-CM | POA: Diagnosis not present

## 2022-08-18 DIAGNOSIS — H02834 Dermatochalasis of left upper eyelid: Secondary | ICD-10-CM | POA: Diagnosis not present

## 2022-08-18 DIAGNOSIS — H02831 Dermatochalasis of right upper eyelid: Secondary | ICD-10-CM | POA: Diagnosis not present

## 2022-08-26 DIAGNOSIS — K5900 Constipation, unspecified: Secondary | ICD-10-CM | POA: Diagnosis not present

## 2022-08-26 DIAGNOSIS — F4321 Adjustment disorder with depressed mood: Secondary | ICD-10-CM | POA: Diagnosis not present

## 2022-08-26 DIAGNOSIS — R1030 Lower abdominal pain, unspecified: Secondary | ICD-10-CM | POA: Diagnosis not present

## 2022-08-26 DIAGNOSIS — K921 Melena: Secondary | ICD-10-CM | POA: Diagnosis not present

## 2022-09-01 DIAGNOSIS — Z713 Dietary counseling and surveillance: Secondary | ICD-10-CM | POA: Diagnosis not present

## 2022-09-02 DIAGNOSIS — N3946 Mixed incontinence: Secondary | ICD-10-CM | POA: Diagnosis not present

## 2022-09-02 DIAGNOSIS — N3020 Other chronic cystitis without hematuria: Secondary | ICD-10-CM | POA: Diagnosis not present

## 2022-09-17 DIAGNOSIS — K5792 Diverticulitis of intestine, part unspecified, without perforation or abscess without bleeding: Secondary | ICD-10-CM | POA: Diagnosis not present

## 2022-09-22 DIAGNOSIS — R109 Unspecified abdominal pain: Secondary | ICD-10-CM | POA: Diagnosis not present

## 2022-10-12 DIAGNOSIS — Z8719 Personal history of other diseases of the digestive system: Secondary | ICD-10-CM | POA: Diagnosis not present

## 2022-10-12 DIAGNOSIS — R194 Change in bowel habit: Secondary | ICD-10-CM | POA: Diagnosis not present

## 2022-10-20 DIAGNOSIS — K5730 Diverticulosis of large intestine without perforation or abscess without bleeding: Secondary | ICD-10-CM | POA: Diagnosis not present

## 2022-10-20 DIAGNOSIS — K5732 Diverticulitis of large intestine without perforation or abscess without bleeding: Secondary | ICD-10-CM | POA: Diagnosis not present

## 2022-11-04 DIAGNOSIS — N898 Other specified noninflammatory disorders of vagina: Secondary | ICD-10-CM | POA: Diagnosis not present

## 2022-11-04 DIAGNOSIS — N9089 Other specified noninflammatory disorders of vulva and perineum: Secondary | ICD-10-CM | POA: Diagnosis not present

## 2022-11-24 DIAGNOSIS — Z713 Dietary counseling and surveillance: Secondary | ICD-10-CM | POA: Diagnosis not present

## 2022-11-25 DIAGNOSIS — N3020 Other chronic cystitis without hematuria: Secondary | ICD-10-CM | POA: Diagnosis not present

## 2022-11-25 DIAGNOSIS — N3946 Mixed incontinence: Secondary | ICD-10-CM | POA: Diagnosis not present

## 2023-02-15 DIAGNOSIS — Z713 Dietary counseling and surveillance: Secondary | ICD-10-CM | POA: Diagnosis not present

## 2023-03-22 DIAGNOSIS — R059 Cough, unspecified: Secondary | ICD-10-CM | POA: Diagnosis not present

## 2023-03-22 DIAGNOSIS — J40 Bronchitis, not specified as acute or chronic: Secondary | ICD-10-CM | POA: Diagnosis not present

## 2023-03-22 IMAGING — US US SOFT TISSUE
1 series · 8 of 8 positions shown · non-contrast
Comparison: None Available.

CLINICAL DATA: Palpable abnormality in the upper left chest wall
posteriorly

EXAM:
ULTRASOUND OF CHEST SOFT TISSUES
TECHNIQUE: Ultrasound examination of the chest wall soft tissues was performed
in the area of clinical concern.

[Series 1: us soft tissue · 0.05mm/px · 8 acquisitions, 8 frames shown]
[im 1/8]
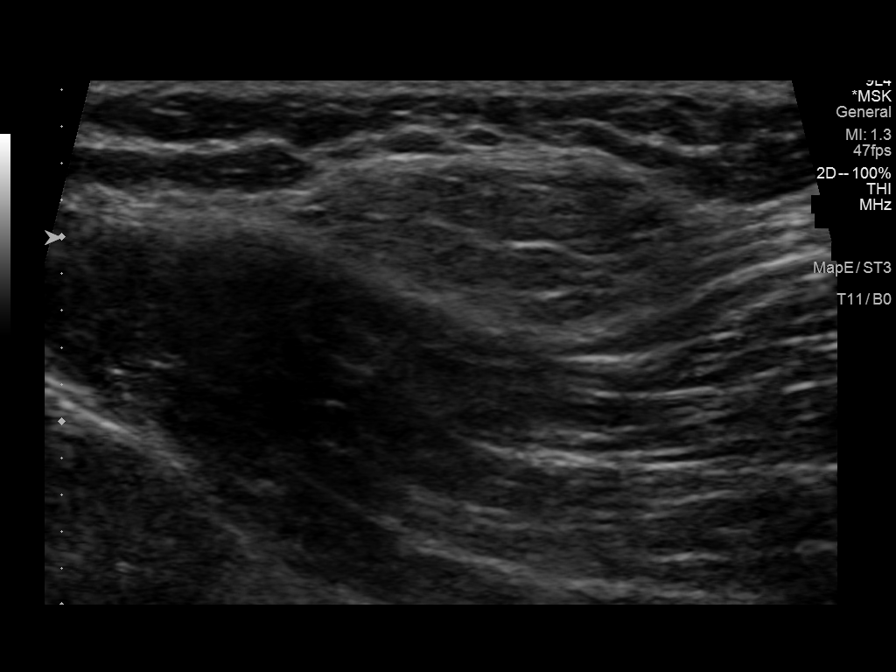
[im 2/8]
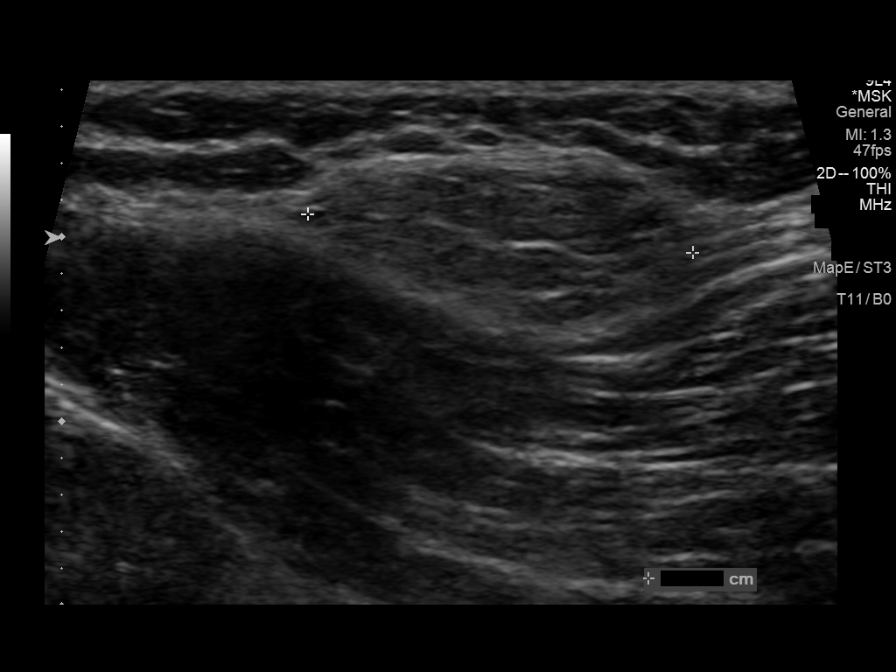
[im 3/8]
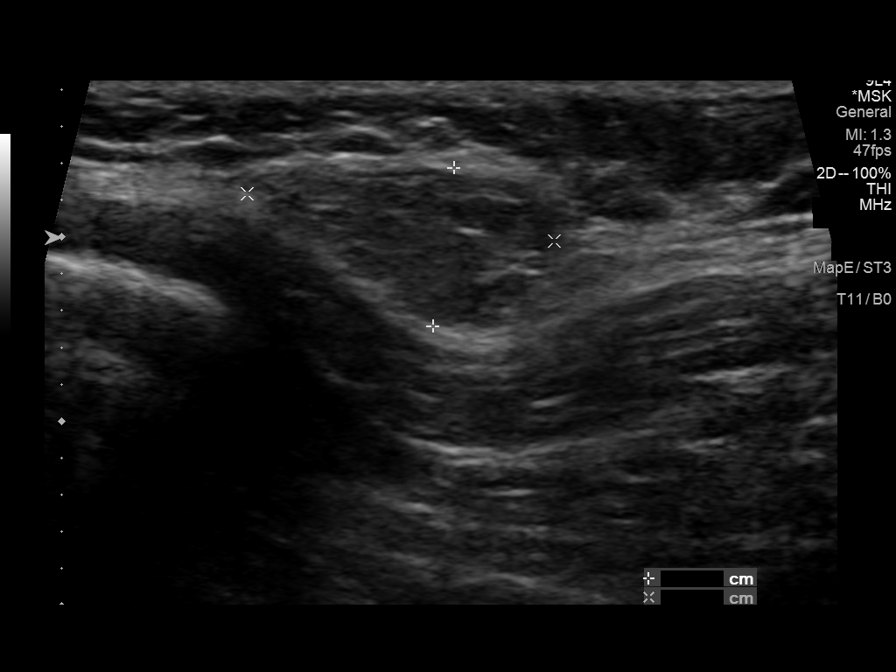
[im 4/8]
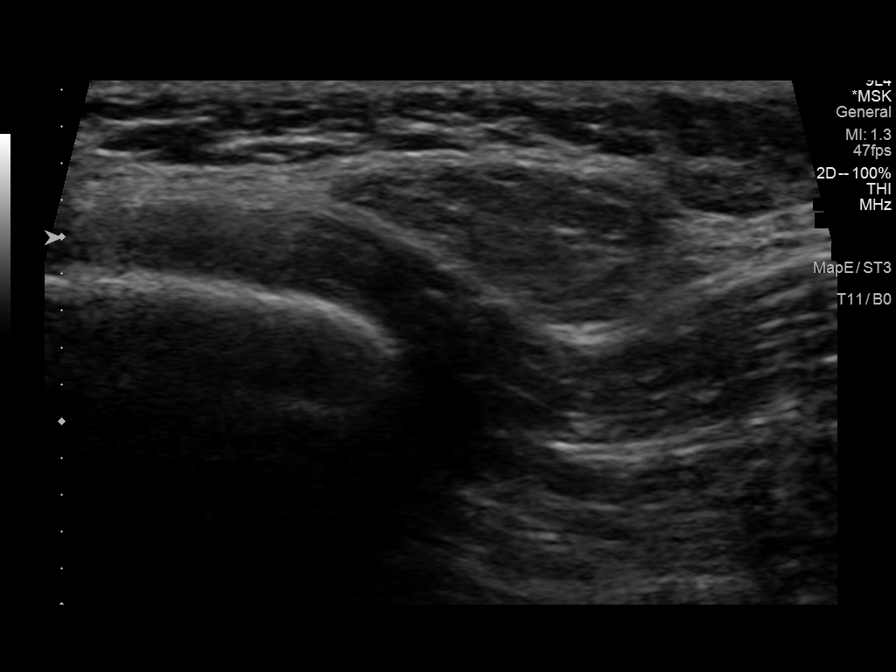
[im 5/8]
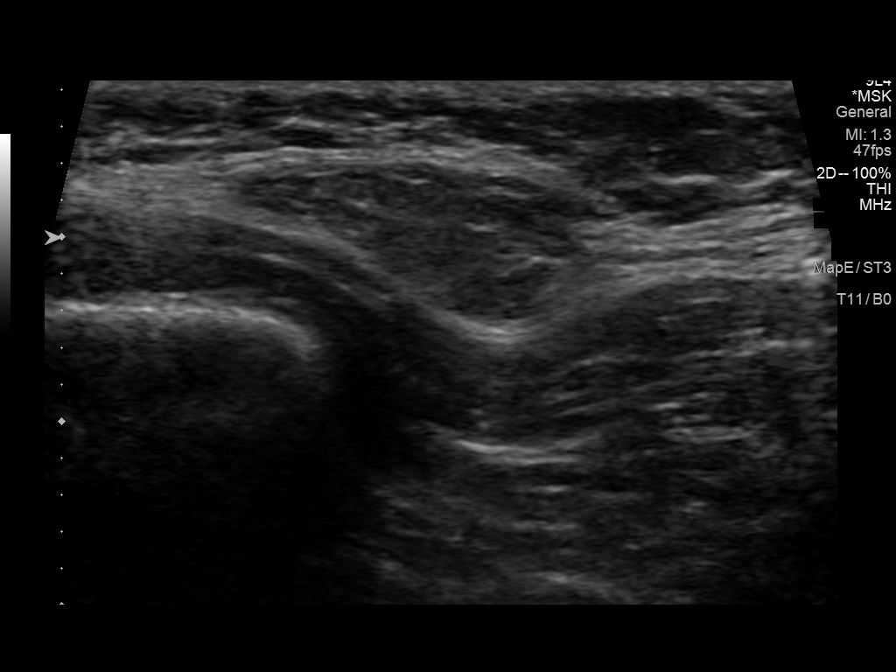
[im 6/8]
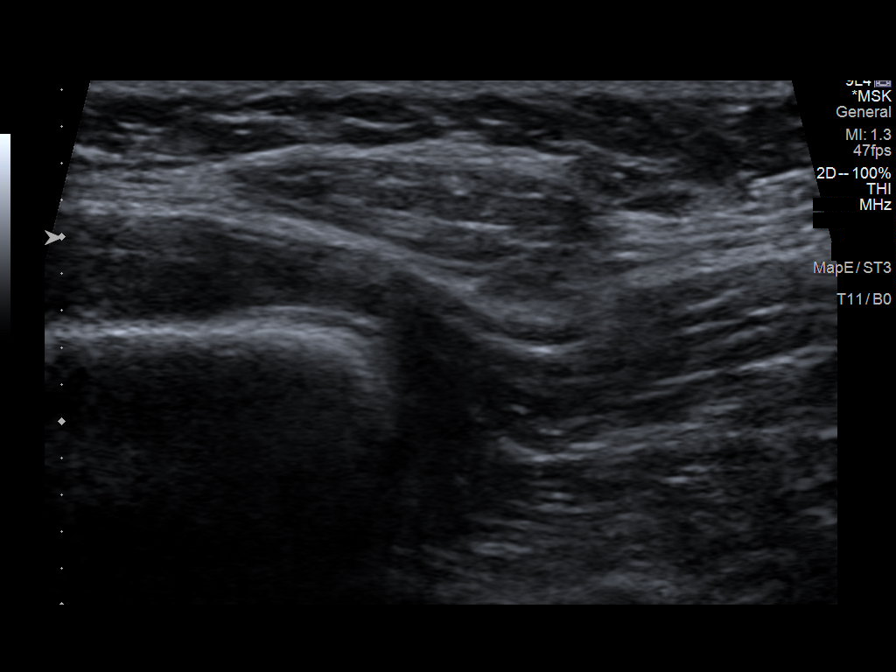
[im 7/8]
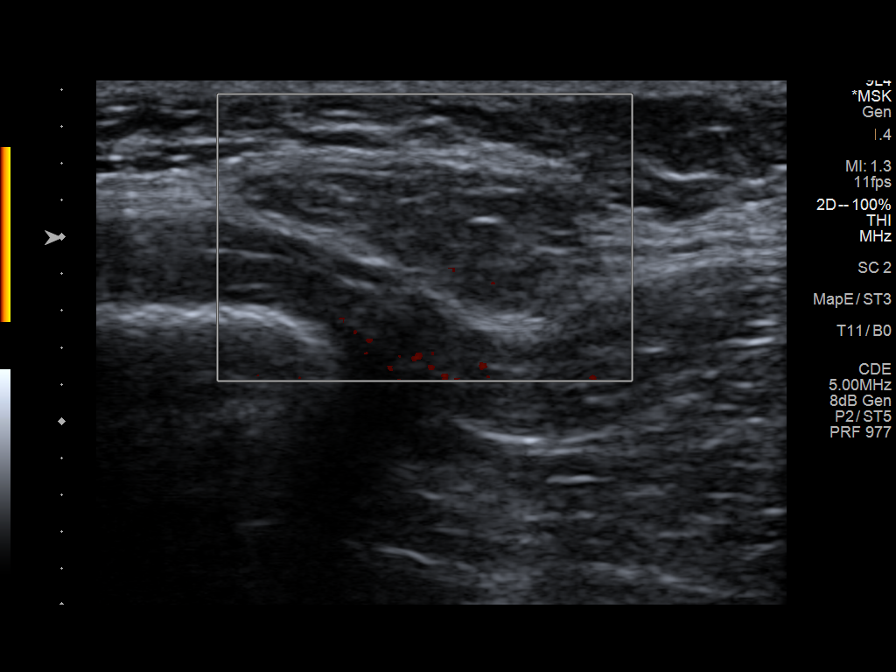
[im 8/8]
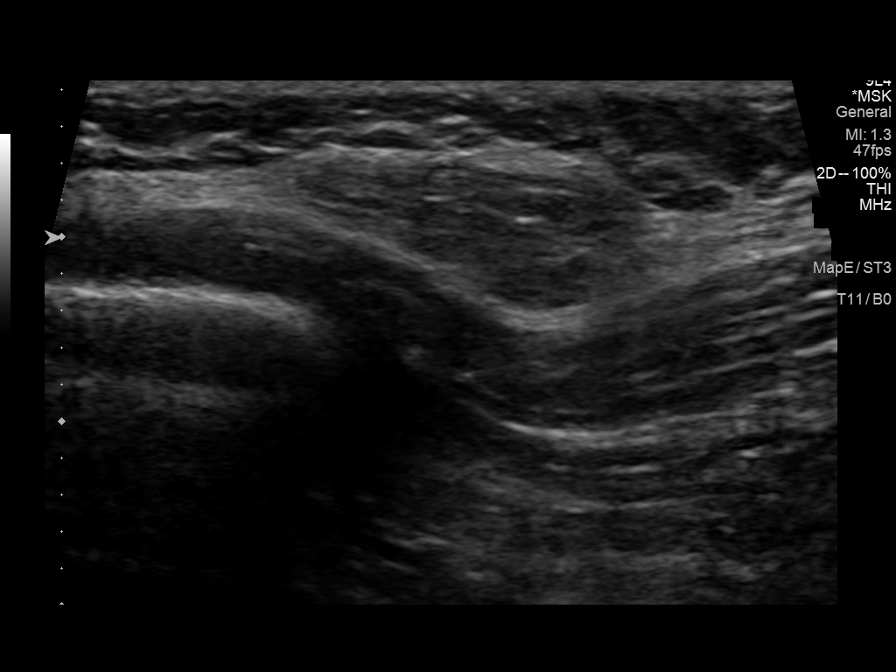

[8 of 8 positions shown; findings below may reference images not displayed]

FINDINGS: Scanning in the area of clinical concern reveals a 2.1 x 1.7 x
cm mildly hyperechoic lesion in the subcutaneous tissues. This is
most consistent with a lipoma.
IMPRESSION: Findings most consistent with subcutaneous lipoma. Clinical
follow-up is recommended.

## 2023-03-25 DIAGNOSIS — Z0000 Encounter for general adult medical examination without abnormal findings: Secondary | ICD-10-CM | POA: Diagnosis not present

## 2023-03-25 DIAGNOSIS — Z13220 Encounter for screening for lipoid disorders: Secondary | ICD-10-CM | POA: Diagnosis not present

## 2023-03-25 DIAGNOSIS — G4700 Insomnia, unspecified: Secondary | ICD-10-CM | POA: Diagnosis not present

## 2023-03-25 DIAGNOSIS — F418 Other specified anxiety disorders: Secondary | ICD-10-CM | POA: Diagnosis not present

## 2023-03-29 DIAGNOSIS — L578 Other skin changes due to chronic exposure to nonionizing radiation: Secondary | ICD-10-CM | POA: Diagnosis not present

## 2023-03-29 DIAGNOSIS — D225 Melanocytic nevi of trunk: Secondary | ICD-10-CM | POA: Diagnosis not present

## 2023-03-29 DIAGNOSIS — L219 Seborrheic dermatitis, unspecified: Secondary | ICD-10-CM | POA: Diagnosis not present

## 2023-03-29 DIAGNOSIS — L603 Nail dystrophy: Secondary | ICD-10-CM | POA: Diagnosis not present

## 2023-07-06 DIAGNOSIS — I10 Essential (primary) hypertension: Secondary | ICD-10-CM | POA: Diagnosis not present

## 2023-07-22 DIAGNOSIS — Z01419 Encounter for gynecological examination (general) (routine) without abnormal findings: Secondary | ICD-10-CM | POA: Diagnosis not present

## 2023-08-11 DIAGNOSIS — G4700 Insomnia, unspecified: Secondary | ICD-10-CM | POA: Diagnosis not present

## 2023-08-11 DIAGNOSIS — L659 Nonscarring hair loss, unspecified: Secondary | ICD-10-CM | POA: Diagnosis not present

## 2023-08-11 DIAGNOSIS — F418 Other specified anxiety disorders: Secondary | ICD-10-CM | POA: Diagnosis not present

## 2023-08-11 DIAGNOSIS — Z789 Other specified health status: Secondary | ICD-10-CM | POA: Diagnosis not present

## 2023-08-11 DIAGNOSIS — R5382 Chronic fatigue, unspecified: Secondary | ICD-10-CM | POA: Diagnosis not present

## 2023-08-11 DIAGNOSIS — I10 Essential (primary) hypertension: Secondary | ICD-10-CM | POA: Diagnosis not present

## 2023-08-19 DIAGNOSIS — Z1231 Encounter for screening mammogram for malignant neoplasm of breast: Secondary | ICD-10-CM | POA: Diagnosis not present

## 2023-12-19 ENCOUNTER — Emergency Department (HOSPITAL_BASED_OUTPATIENT_CLINIC_OR_DEPARTMENT_OTHER)
Admission: EM | Admit: 2023-12-19 | Discharge: 2023-12-19 | Disposition: A | Discharge status: Home / Self Care | Attending: Emergency Medicine | Admitting: Emergency Medicine

## 2023-12-19 ENCOUNTER — Other Ambulatory Visit: Payer: Self-pay

## 2023-12-19 DIAGNOSIS — Z7982 Long term (current) use of aspirin: Secondary | ICD-10-CM | POA: Insufficient documentation

## 2023-12-19 DIAGNOSIS — R1032 Left lower quadrant pain: Secondary | ICD-10-CM | POA: Insufficient documentation

## 2023-12-19 LAB — URINALYSIS, ROUTINE W REFLEX MICROSCOPIC
Bilirubin Urine: NEGATIVE
Glucose, UA: NEGATIVE mg/dL
Hgb urine dipstick: NEGATIVE
Ketones, ur: NEGATIVE mg/dL
Leukocytes,Ua: NEGATIVE
Nitrite: NEGATIVE
Protein, ur: NEGATIVE mg/dL
Specific Gravity, Urine: 1.009 (ref 1.005–1.030)
pH: 8 (ref 5.0–8.0)

## 2023-12-19 LAB — CBC
HCT: 37.7 % (ref 36.0–46.0)
Hemoglobin: 12.8 g/dL (ref 12.0–15.0)
MCH: 29.2 pg (ref 26.0–34.0)
MCHC: 34 g/dL (ref 30.0–36.0)
MCV: 85.9 fL (ref 80.0–100.0)
Platelets: 168 10*3/uL (ref 150–400)
RBC: 4.39 MIL/uL (ref 3.87–5.11)
RDW: 13.2 % (ref 11.5–15.5)
WBC: 9.1 10*3/uL (ref 4.0–10.5)
nRBC: 0 % (ref 0.0–0.2)

## 2023-12-19 LAB — BASIC METABOLIC PANEL WITH GFR
Anion gap: 11 (ref 5–15)
BUN: 8 mg/dL (ref 6–20)
CO2: 25 mmol/L (ref 22–32)
Calcium: 9.6 mg/dL (ref 8.9–10.3)
Chloride: 105 mmol/L (ref 98–111)
Creatinine, Ser: 0.63 mg/dL (ref 0.44–1.00)
GFR, Estimated: >60 mL/min (ref >60)
Glucose, Bld: 96 mg/dL (ref 70–99)
Potassium: 3.8 mmol/L (ref 3.5–5.1)
Sodium: 141 mmol/L (ref 135–145)

## 2023-12-19 MED ORDER — METRONIDAZOLE 500 MG PO TABS: 500.0000 mg | ORAL_TABLET | Freq: Three times a day (TID) | ORAL | 0 refills | Status: AC

## 2023-12-19 MED ORDER — ONDANSETRON 4 MG PO TBDP: 4.0000 mg | ORAL_TABLET | Freq: Three times a day (TID) | ORAL | 0 refills | Status: AC | PRN

## 2023-12-19 MED ORDER — CIPROFLOXACIN HCL 500 MG PO TABS: 500.0000 mg | ORAL_TABLET | Freq: Two times a day (BID) | ORAL | 0 refills | Status: AC

## 2023-12-19 NOTE — ED Triage Notes (Signed)
 Pt caox4, ambulatory c/o "diverticulitis flare up" with LUQ abd pain since last night stating that it feels consistent with when she has had diverticulitis in the past.

## 2023-12-19 NOTE — ED Provider Notes (Signed)
 [facility] EMERGENCY DEPARTMENT AT [facility] Provider Note   CSN: [id number] Arrival date & time: 12/19/23  1039     History  No chief complaint on file.   Ariana Shepard is a 58 y.o. female.  HPI   58 year old female presents emergency department with complaints of left lower abdominal pain.  States that she began to notice symptoms last night.  Reports history of diverticulitis and states this feels "the exact same."  Denies any fevers, chills, nausea vomiting, urinary symptoms, change in bowel habits.  Does report dietary changes recently and concerned that this may have exacerbated her symptoms.  States that she has had symptoms resolved with antibiotics as well as dietary modifications in the past.  Past medical history significant for overactive bladder, diverticulitis, tension headache, adhesive capsulitis, chronic fatigue syndrome  Home Medications Prior to Admission medications   Medication Sig Start Date End Date Taking? Authorizing Provider  acetaminophen (TYLENOL) 325 MG tablet 1 tablet as needed    [provider]  albuterol (VENTOLIN HFA) 108 (90 Base) MCG/ACT inhaler Inhale into the lungs. 01/13/21   [provider]  ALPRAZolam (XANAX) 0.25 MG tablet 1 tablet 04/23/21   [provider]  aspirin 81 MG chewable tablet 1 tablet    [provider]  Cholecalciferol (VITAMIN D3) 50 MCG (2000 UT) capsule 5 capsules    [provider]  COLLAGEN PO Take 1 tablet by mouth.    [provider]  diphenhydrAMINE (BENADRYL) 25 MG tablet 1 tablet at bedtime as needed    [provider]  melatonin 3 MG TABS tablet 3 tablets at bedtime as needed with food    [provider]  naproxen sodium (ALEVE) 220 MG tablet 1 tablet with food or milk as needed    [provider]  nitrofurantoin (MACRODANTIN) 100 MG capsule SMARTSIG:1 Capsule(s) By Mouth Every 3 Days 03/25/21   [provider]   Nutritional Supplements (FRUIT & VEGETABLE DAILY) CAPS See admin instructions.    [provider]  solifenacin (VESICARE) 5 MG tablet 1 tablet 07/02/20   [provider]  Wheat Dextrin (BENEFIBER DRINK MIX PO) Take 1.5 tablets by mouth daily.    [provider]  zolpidem (AMBIEN) 10 MG tablet 1 tablet at bedtime as needed 04/23/21   [provider]      Allergies    Patient has no known allergies.    Review of Systems   Review of Systems  All other systems reviewed and are negative.   Physical Exam Updated Vital Signs There were no vitals taken for this visit. Physical Exam Vitals and nursing note reviewed.  Constitutional:      General: She is not in acute distress.    Appearance: She is well-developed.  HENT:     Head: Normocephalic and atraumatic.  Eyes:     Conjunctiva/sclera: Conjunctivae normal.  Cardiovascular:     Rate and Rhythm: Normal rate and regular rhythm.     Heart sounds: No murmur heard. Pulmonary:     Effort: Pulmonary effort is normal. No respiratory distress.     Breath sounds: Normal breath sounds.  Abdominal:     Palpations: Abdomen is soft.     Tenderness: There is abdominal tenderness in the left lower quadrant.  Musculoskeletal:        General: No swelling.     Cervical back: Neck supple.  Skin:    General: Skin is warm and dry.     Capillary Refill:  Capillary refill takes less than 2 seconds.  Neurological:     Mental Status: She is alert.  Psychiatric:        Mood and Affect: Mood normal.     ED Results / Procedures / Treatments   Labs (all labs ordered are listed, but only abnormal results are displayed) Labs Reviewed - No data to display  EKG None  Radiology No results found.  Procedures Procedures    Medications Ordered in ED Medications - No data to display  ED Course/ Medical Decision Making/ A&P                                 Medical Decision Making Amount and/or Complexity of  Data Reviewed Labs: ordered.  Risk Prescription drug management.   This patient presents to the ED for concern of abdominal pain, this involves an extensive number of treatment options, and is a complaint that carries with it a high risk of complications and morbidity.  The differential diagnosis includes gastritis, PUD, cholecystitis, CBD pathology, SBO/LBO, volvulus, diverticulitis, appendicitis, cystitis, pyelonephritis, nephrolithiasis, other   Co morbidities that complicate the patient evaluation  See HPI   Additional history obtained:  Additional history obtained from EMR External records from outside source obtained and reviewed including hospital records   Lab Tests:  I Ordered, and personally interpreted labs.  The pertinent results include: No leukocytosis.  No evidence of anemia.  Lites within range.  No electrolyte abnormalities.  No renal dysfunction.  UA without abnormality.   Imaging Studies ordered:  N/a   Cardiac Monitoring: / EKG:  The patient was maintained on a cardiac monitor.  I personally viewed and interpreted the cardiac monitored which showed an underlying rhythm of: sinus rhythm   Consultations Obtained:  N/a   Problem List / ED Course / Critical interventions / Medication management  Left lower quadrant abdominal pain Reevaluation of the patient showed that the patient stayed the same I have reviewed the patients home medicines and have made adjustments as needed   Social Determinants of Health:  Denies tobacco, illicit drug use.   Test / Admission - Considered:  Left lower quadrant abdominal pain Vitals signs within normal range and stable throughout visit. Laboratory/imaging studies significant for: See above 58 year old female presents emergency department with complaints of left lower abdominal pain.  States that she began to notice symptoms last night.  Reports history of diverticulitis and states this feels "the exact same."   Denies any fevers, chills, nausea vomiting, urinary symptoms, change in bowel habits.  Does report dietary changes recently and concerned that this may have exacerbated her symptoms.  States that she has had symptoms resolved with antibiotics as well as dietary modifications in the past. On exam, focal tenderness left lower quadrant without radiation.  Laboratory studies unremarkable for any acute process.  Given patient's history of diverticulitis with describing this as "the exact same," with focal tenderness llq with no evidence of peritonitis and reassuring laboratory studies, CT imaging was foregone after shared decision-making conversation was had with patient.  Will treat empirically with ciprofloxacin  as well as Flagyl  given patient tolerating of these antibiotics well in the past.  Will also recommend dietary/lifestyle changes as described in AVS and close follow-up with PCP/GI in the outpatient setting for reassessment of symptoms.  Strict return precautions discussed at length.  Treatment plan discussed with patient and she acknowledged understanding was agreeable to said plan.  Patient overall well-appearing, afebrile in no acute distress. Worrisome signs and symptoms were discussed with the patient, and the patient acknowledged understanding to return to the ED if noticed. Patient was stable upon discharge.          Final Clinical Impression(s) / ED Diagnoses Final diagnoses:  None    Rx / DC Orders ED Discharge Orders     None         [place], [place] 12/19/23 1212    Wynetta Heckle, MD 12/22/23 1544

## 2023-12-19 NOTE — Discharge Instructions (Addendum)
 Your labs appeared normal.  Given your history of diverticulitis as well as similar symptoms as you have had in the past, will treat empirically with 2 antibiotics for treatment of infection.  Recommend dietary changes as we discussed and as attached to your discharge papers and the information regarding diverticulitis.  Will also send in nausea medicine if you develop symptoms of nausea/vomiting.  Recommend follow-up with your PCP/GI specialist in the outpatient setting for reassessment.  Please do not hesitate to return if you develop intractable pain, worsening pain, pain spreading, fever, chills, intractable nausea/vomiting or other abnormality we discussed.

## 2024-01-04 DIAGNOSIS — H43811 Vitreous degeneration, right eye: Secondary | ICD-10-CM | POA: Diagnosis not present

## 2024-01-04 DIAGNOSIS — H25011 Cortical age-related cataract, right eye: Secondary | ICD-10-CM | POA: Diagnosis not present

## 2024-01-04 DIAGNOSIS — H16143 Punctate keratitis, bilateral: Secondary | ICD-10-CM | POA: Diagnosis not present

## 2024-01-12 DIAGNOSIS — F331 Major depressive disorder, recurrent, moderate: Secondary | ICD-10-CM | POA: Diagnosis not present

## 2024-01-21 DIAGNOSIS — N9489 Other specified conditions associated with female genital organs and menstrual cycle: Secondary | ICD-10-CM | POA: Diagnosis not present

## 2024-01-21 DIAGNOSIS — R300 Dysuria: Secondary | ICD-10-CM | POA: Diagnosis not present

## 2024-01-21 DIAGNOSIS — Z7251 High risk heterosexual behavior: Secondary | ICD-10-CM | POA: Diagnosis not present

## 2024-02-10 DIAGNOSIS — H25011 Cortical age-related cataract, right eye: Secondary | ICD-10-CM | POA: Diagnosis not present

## 2024-02-10 DIAGNOSIS — H0288B Meibomian gland dysfunction left eye, upper and lower eyelids: Secondary | ICD-10-CM | POA: Diagnosis not present

## 2024-02-10 DIAGNOSIS — H16141 Punctate keratitis, right eye: Secondary | ICD-10-CM | POA: Diagnosis not present

## 2024-02-10 DIAGNOSIS — H0288A Meibomian gland dysfunction right eye, upper and lower eyelids: Secondary | ICD-10-CM | POA: Diagnosis not present

## 2024-03-28 DIAGNOSIS — E782 Mixed hyperlipidemia: Secondary | ICD-10-CM | POA: Diagnosis not present

## 2024-03-28 DIAGNOSIS — M8589 Other specified disorders of bone density and structure, multiple sites: Secondary | ICD-10-CM | POA: Diagnosis not present

## 2024-03-28 DIAGNOSIS — F418 Other specified anxiety disorders: Secondary | ICD-10-CM | POA: Diagnosis not present

## 2024-03-28 DIAGNOSIS — Z0000 Encounter for general adult medical examination without abnormal findings: Secondary | ICD-10-CM | POA: Diagnosis not present

## 2024-03-28 DIAGNOSIS — R5383 Other fatigue: Secondary | ICD-10-CM | POA: Diagnosis not present

## 2024-03-28 DIAGNOSIS — Z8719 Personal history of other diseases of the digestive system: Secondary | ICD-10-CM | POA: Diagnosis not present

## 2024-04-19 DIAGNOSIS — L814 Other melanin hyperpigmentation: Secondary | ICD-10-CM | POA: Diagnosis not present

## 2024-04-19 DIAGNOSIS — D225 Melanocytic nevi of trunk: Secondary | ICD-10-CM | POA: Diagnosis not present

## 2024-04-19 DIAGNOSIS — L578 Other skin changes due to chronic exposure to nonionizing radiation: Secondary | ICD-10-CM | POA: Diagnosis not present

## 2024-04-19 DIAGNOSIS — D2371 Other benign neoplasm of skin of right lower limb, including hip: Secondary | ICD-10-CM | POA: Diagnosis not present

## 2024-05-03 DIAGNOSIS — Z713 Dietary counseling and surveillance: Secondary | ICD-10-CM | POA: Diagnosis not present

## 2024-05-09 DIAGNOSIS — R140 Abdominal distension (gaseous): Secondary | ICD-10-CM | POA: Diagnosis not present

## 2024-05-09 DIAGNOSIS — R194 Change in bowel habit: Secondary | ICD-10-CM | POA: Diagnosis not present

## 2024-05-09 DIAGNOSIS — K5730 Diverticulosis of large intestine without perforation or abscess without bleeding: Secondary | ICD-10-CM | POA: Diagnosis not present

## 2024-05-09 DIAGNOSIS — R1032 Left lower quadrant pain: Secondary | ICD-10-CM | POA: Diagnosis not present

## 2024-06-06 DIAGNOSIS — Z713 Dietary counseling and surveillance: Secondary | ICD-10-CM | POA: Diagnosis not present

## 2024-06-20 DIAGNOSIS — M8589 Other specified disorders of bone density and structure, multiple sites: Secondary | ICD-10-CM | POA: Diagnosis not present
